# Patient Record
Sex: Male | Born: 1985 | Race: Black or African American | Hispanic: No | Marital: Single | Smoking: Never smoker
Health system: Southern US, Community
[De-identification: ages and names within clinical notes are randomized; demographics above are authoritative.]

---

## 2014-01-29 ENCOUNTER — Ambulatory Visit: Payer: BC Managed Care – PPO

## 2014-01-29 ENCOUNTER — Ambulatory Visit (INDEPENDENT_AMBULATORY_CARE_PROVIDER_SITE_OTHER): Payer: BC Managed Care – PPO | Admitting: Internal Medicine

## 2014-01-29 VITALS — BP 108/60 | HR 67 | Temp 98.4°F | Resp 16 | Ht 66.5 in | Wt 205.2 lb

## 2014-01-29 DIAGNOSIS — M25552 Pain in left hip: Secondary | ICD-10-CM

## 2014-01-29 DIAGNOSIS — T148XXA Other injury of unspecified body region, initial encounter: Secondary | ICD-10-CM

## 2014-01-29 DIAGNOSIS — R079 Chest pain, unspecified: Secondary | ICD-10-CM

## 2014-01-29 DIAGNOSIS — M25559 Pain in unspecified hip: Secondary | ICD-10-CM

## 2014-01-29 DIAGNOSIS — M549 Dorsalgia, unspecified: Secondary | ICD-10-CM

## 2014-01-29 DIAGNOSIS — S7000XA Contusion of unspecified hip, initial encounter: Secondary | ICD-10-CM

## 2014-01-29 DIAGNOSIS — M545 Low back pain, unspecified: Secondary | ICD-10-CM

## 2014-01-29 DIAGNOSIS — M542 Cervicalgia: Secondary | ICD-10-CM

## 2014-01-29 MED ORDER — CYCLOBENZAPRINE HCL 10 MG PO TABS: 10.0000 mg | ORAL_TABLET | Freq: Three times a day (TID) | ORAL | Status: AC | PRN

## 2014-01-29 MED ORDER — TRAMADOL HCL 50 MG PO TABS: 50.0000 mg | ORAL_TABLET | Freq: Three times a day (TID) | ORAL | Status: AC | PRN

## 2014-01-29 NOTE — Patient Instructions (Signed)
Ibuprofen 600 mg every 8 hours if needed for pain. Flexeril as directed for muscle spasm. Ultram as directed for pain. Ice to all injured areas. If your symptoms worsen or if you develop new symptoms return to the er.
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
## 2014-02-01 ENCOUNTER — Telehealth: Payer: Self-pay

## 2014-02-01 NOTE — Telephone Encounter (Signed)
Patient called requesting a note to be excused from a military exercise. Patient was seen here recently, and stated that he was told by a doctor to call in for a note if he was still in pain. Patient would like to be called back today, Thursday 02/01/2014 at phn # [phone].       Thank You!!!

## 2014-02-01 NOTE — Telephone Encounter (Signed)
Patient saw Dr. Mindi JunkerGottlieb.  Please advise.

## 2014-02-01 NOTE — Telephone Encounter (Signed)
Prepared note. Spoke to patient and told him his note is ready for pickup at front desk of 102.  Advised him to RTC if no better at that time.

## 2014-02-01 NOTE — Telephone Encounter (Signed)
Ok to provide a note for pt through 02/05/14 (which would be 1 wk from his visit here) - if he is not improving by that time, he should RTC for further evaluation

## 2014-02-05 ENCOUNTER — Ambulatory Visit (INDEPENDENT_AMBULATORY_CARE_PROVIDER_SITE_OTHER): Payer: BC Managed Care – PPO | Admitting: Internal Medicine

## 2014-02-05 VITALS — BP 110/66 | HR 92 | Temp 98.4°F | Resp 18 | Ht 67.0 in | Wt 207.0 lb

## 2014-02-05 DIAGNOSIS — M545 Low back pain, unspecified: Secondary | ICD-10-CM

## 2014-02-05 MED ORDER — MELOXICAM 15 MG PO TABS: 15.0000 mg | ORAL_TABLET | Freq: Every day | ORAL | Status: AC
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
